# Patient Record
Sex: Male | Born: 1975 | Race: White | Hispanic: No | Marital: Single | Smoking: Current some day smoker
Health system: Southern US, Community
[De-identification: ages and names within clinical notes are randomized; demographics above are authoritative.]

## PROBLEM LIST (undated history)

## (undated) DIAGNOSIS — I10 Essential (primary) hypertension: Secondary | ICD-10-CM

---

## 2017-12-01 ENCOUNTER — Encounter (HOSPITAL_COMMUNITY): Payer: Self-pay | Admitting: Emergency Medicine

## 2017-12-01 ENCOUNTER — Emergency Department (HOSPITAL_COMMUNITY): Payer: Self-pay

## 2017-12-01 ENCOUNTER — Emergency Department (HOSPITAL_COMMUNITY)
Admission: EM | Admit: 2017-12-01 | Discharge: 2017-12-01 | Disposition: A | Discharge status: Home / Self Care | Payer: Self-pay | Attending: Emergency Medicine | Admitting: Emergency Medicine

## 2017-12-01 ENCOUNTER — Other Ambulatory Visit: Payer: Self-pay

## 2017-12-01 DIAGNOSIS — F17200 Nicotine dependence, unspecified, uncomplicated: Secondary | ICD-10-CM | POA: Insufficient documentation

## 2017-12-01 DIAGNOSIS — Y9301 Activity, walking, marching and hiking: Secondary | ICD-10-CM | POA: Insufficient documentation

## 2017-12-01 DIAGNOSIS — W0110XA Fall on same level from slipping, tripping and stumbling with subsequent striking against unspecified object, initial encounter: Secondary | ICD-10-CM | POA: Insufficient documentation

## 2017-12-01 DIAGNOSIS — S42202A Unspecified fracture of upper end of left humerus, initial encounter for closed fracture: Secondary | ICD-10-CM

## 2017-12-01 DIAGNOSIS — I10 Essential (primary) hypertension: Secondary | ICD-10-CM | POA: Insufficient documentation

## 2017-12-01 DIAGNOSIS — Y999 Unspecified external cause status: Secondary | ICD-10-CM | POA: Insufficient documentation

## 2017-12-01 DIAGNOSIS — Y929 Unspecified place or not applicable: Secondary | ICD-10-CM | POA: Insufficient documentation

## 2017-12-01 HISTORY — DX: Essential (primary) hypertension: I10

## 2017-12-01 MED ORDER — HYDROMORPHONE HCL 1 MG/ML IJ SOLN
1.0000 mg | Freq: Once | INTRAMUSCULAR | Status: AC
  Administered 2017-12-01: 1 mg via INTRAMUSCULAR
  Filled 2017-12-01: qty 1

## 2017-12-01 MED ORDER — ONDANSETRON 4 MG PO TBDP
8.0000 mg | ORAL_TABLET | Freq: Once | ORAL | Status: AC
  Administered 2017-12-01: 8 mg via ORAL
  Filled 2017-12-01: qty 2

## 2017-12-01 MED ORDER — HYDROCODONE-ACETAMINOPHEN 5-325 MG PO TABS: 1.0000 | ORAL_TABLET | ORAL | 0 refills | Status: AC | PRN

## 2017-12-01 NOTE — ED Provider Notes (Signed)
[facility] EMERGENCY DEPARTMENT Provider Note   CSN: [id number] Arrival date & time: 12/01/17  0133     History   Chief Complaint Chief Complaint  Patient presents with  . Shoulder Injury    HPI Drew Howard is a 42 y.o. male.  Patient presents to the emergency department for evaluation of left shoulder injury.  Patient reports that he was walking outside, lost his balance going down a small step and fell up against a tree.  He tried to brace himself with his left arm, but ended up hitting the tree with moderate force.  He did not hit his head.  No loss of conscious.  No neck or back pain.  Patient complained of moderate to severe left shoulder pain and inability to move the arm at the shoulder.      Past Medical History:  Diagnosis Date  . Hypertension     There are no active problems to display for this patient.   History reviewed. No pertinent surgical history.     Home Medications    Prior to Admission medications   Not on File    Family History No family history on file.  Social History Social History   Tobacco Use  . Smoking status: Current Some Day Smoker  . Smokeless tobacco: Never Used  Substance Use Topics  . Alcohol use: No    Frequency: Never  . Drug use: No     Allergies   Patient has no known allergies.   Review of Systems Review of Systems  Musculoskeletal: Positive for arthralgias.  All other systems reviewed and are negative.    Physical Exam Updated Vital Signs BP (!) 161/110 (BP Location: Right Arm)   Pulse 100   Temp 98 F (36.7 C) (Oral)   Resp 14   SpO2 99%   Physical Exam  Constitutional: He is oriented to person, place, and time. He appears well-developed and well-nourished. No distress.  HENT:  Head: Normocephalic and atraumatic.  Right Ear: Hearing normal.  Left Ear: Hearing normal.  Nose: Nose normal.  Mouth/Throat: Oropharynx is clear and moist and mucous membranes are  normal.  Eyes: Conjunctivae and EOM are normal. Pupils are equal, round, and reactive to light.  Neck: Normal range of motion. Neck supple.  Cardiovascular: Regular rhythm, S1 normal and S2 normal. Exam reveals no gallop and no friction rub.  No murmur heard. Pulmonary/Chest: Effort normal and breath sounds normal. No respiratory distress. He exhibits no tenderness.  Abdominal: Soft. Normal appearance. There is no hepatosplenomegaly. There is no tenderness. There is no rebound, no guarding, no tenderness at McBurney's point and negative Murphy's sign. No hernia.  Musculoskeletal:       Left shoulder: He exhibits decreased range of motion and tenderness.  Neurological: He is alert and oriented to person, place, and time. He has normal strength. No cranial nerve deficit or sensory deficit. Coordination normal. GCS eye subscore is 4. GCS verbal subscore is 5. GCS motor subscore is 6.  Skin: Skin is warm, dry and intact. No rash noted. No cyanosis.  Psychiatric: He has a normal mood and affect. His speech is normal and behavior is normal. Thought content normal.  Nursing note and vitals reviewed.    ED Treatments / Results  Labs (all labs ordered are listed, but only abnormal results are displayed) Labs Reviewed - No data to display  EKG  EKG Interpretation None       Radiology Dg Shoulder Left  Result  Date: 12/01/2017 CLINICAL DATA:  42 y/o  M; fall with left shoulder pain. EXAM: LEFT SHOULDER - 2+ VIEW COMPARISON:  None. FINDINGS: Comminuted fracture of the left proximal humerus involving surgical neck, greater tuberosity, and lesser tuberosities with displacement of fracture fragments. Humeral head is well seated on the glenoid. No other fracture identified. IMPRESSION: Comminuted fracture of left proximal humerus.  No dislocation. Electronically Signed   By: Mitzi HansenLance  Furusawa-Stratton M.D.   On: 12/01/2017 02:34    Procedures Procedures (including critical care time)  Medications  Ordered in ED Medications - No data to display   Initial Impression / Assessment and Plan / ED Course  I have reviewed the triage vital signs and the nursing notes.  Pertinent labs & imaging results that were available during my care of the patient were reviewed by me and considered in my medical decision making (see chart for details).     Patient presents to the ER with isolated left shoulder injury.  X-ray shows comminuted fracture of the proximal humerus without dislocation.  Patient is neurovascularly intact.  Patient placed in sling, provided analgesia.  He will follow-up with orthopedics as an outpatient.  Final Clinical Impressions(s) / ED Diagnoses   Final diagnoses:  Closed fracture of proximal end of left humerus, unspecified fracture morphology, initial encounter    ED Discharge Orders    None       Gilda CreasePollina, Christopher J, MD 12/01/17 0300

## 2017-12-01 NOTE — ED Notes (Signed)
ED Provider at bedside. 

## 2017-12-01 NOTE — ED Triage Notes (Signed)
Patient was outside, was coming back into the house fell into an oak tree on a down slope.  Patient now with left shoulder pain.  Patient remembers the incident, did not hit his head.  CSMT's and pulses intact.

## 2018-09-09 IMAGING — CR DG SHOULDER 2+V*L*
2 series · 2 of 2 positions shown · non-contrast
Comparison: None.

CLINICAL DATA: 41 y/o  M; fall with left shoulder pain.

EXAM:
LEFT SHOULDER - 2+ VIEW

[shoulder grashey]
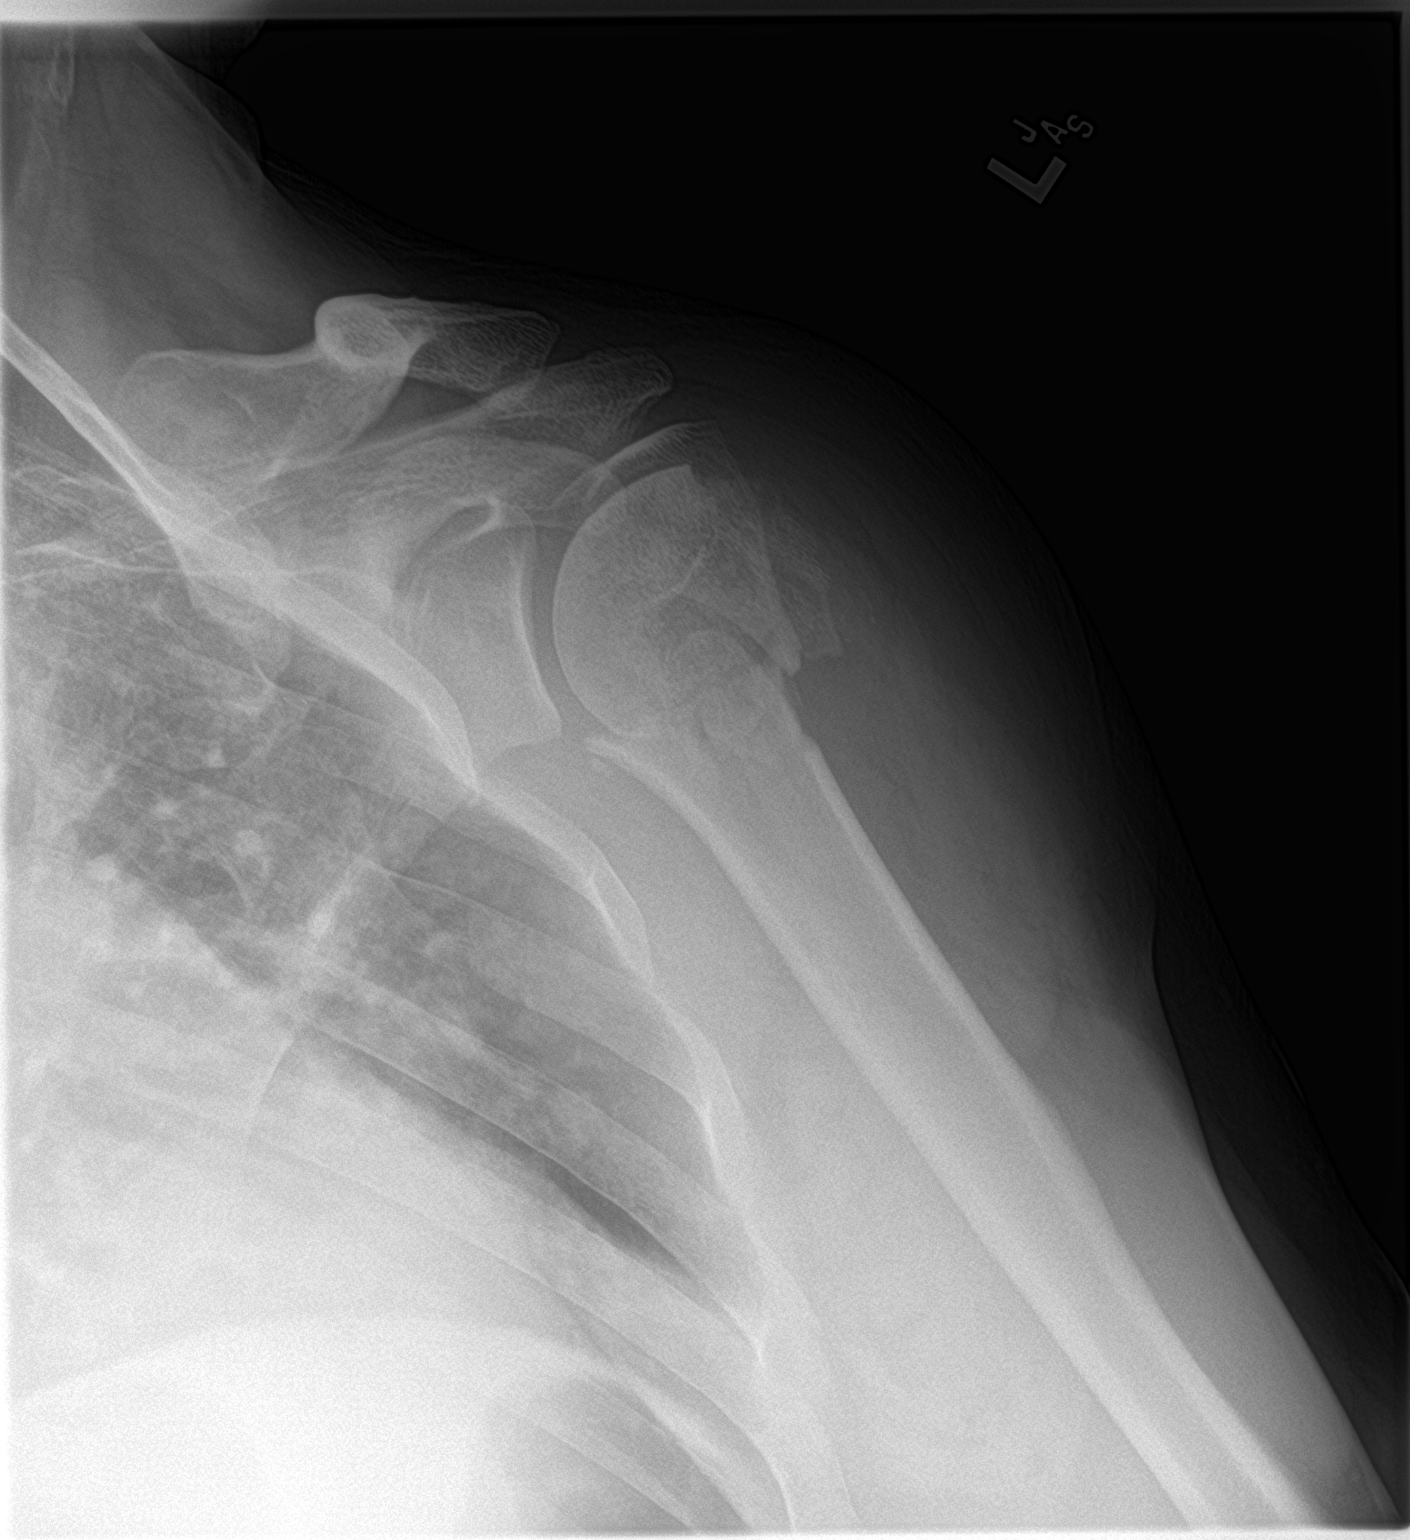

[shoulder y view]
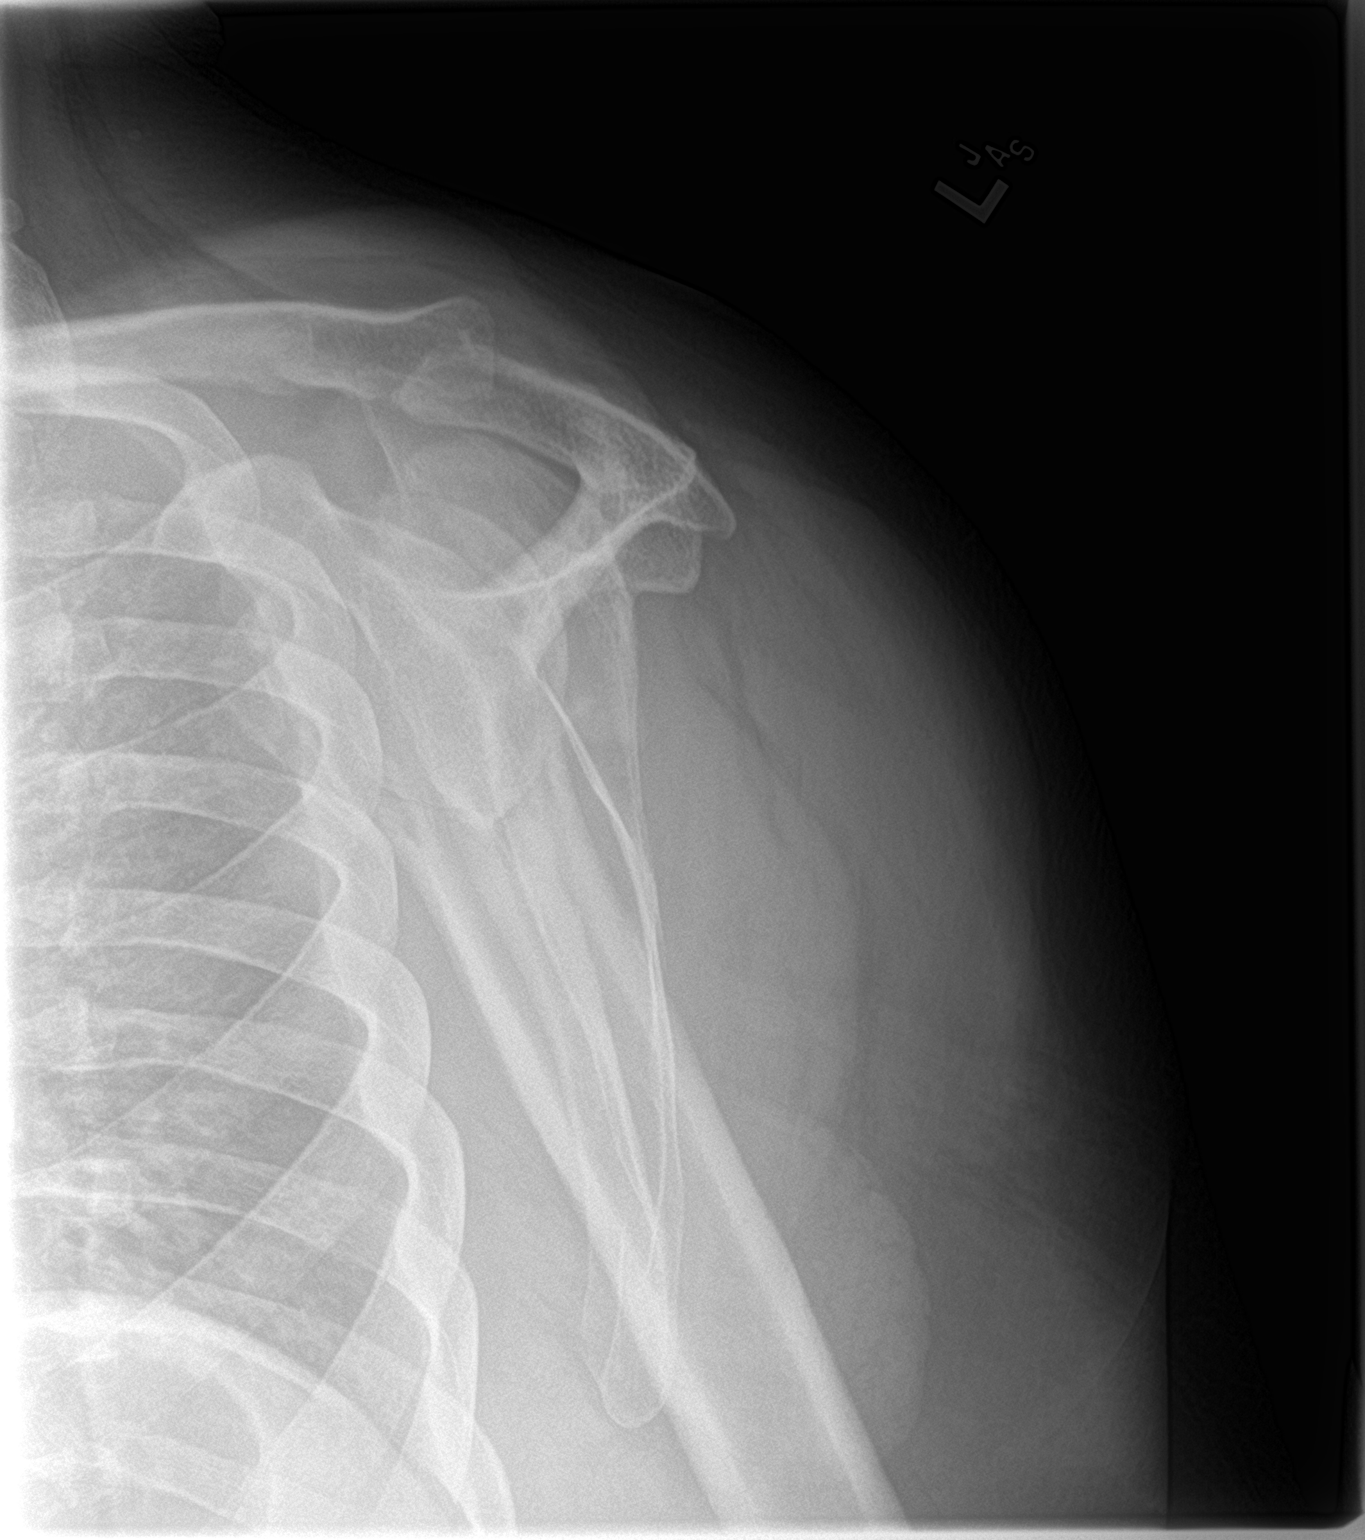

[2 of 2 positions shown; findings below may reference images not displayed]

FINDINGS: Comminuted fracture of the left proximal humerus involving surgical
neck, greater tuberosity, and lesser tuberosities with displacement
of fracture fragments. Humeral head is well seated on the glenoid.
No other fracture identified.
IMPRESSION: Comminuted fracture of left proximal humerus.  No dislocation.

By: Milence Goebels M.D.
